# Patient Record
Sex: Female | Born: 2000 | Race: Black or African American | Hispanic: No | Marital: Single | Smoking: Never smoker
Health system: Southern US, Community
[De-identification: ages and names within clinical notes are randomized; demographics above are authoritative.]

## PROBLEM LIST (undated history)

## (undated) DIAGNOSIS — Z789 Other specified health status: Secondary | ICD-10-CM

## (undated) HISTORY — DX: Other specified health status: Z78.9

## (undated) HISTORY — PX: TYMPANOSTOMY TUBE PLACEMENT: SHX32

---
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
## 2019-06-18 ENCOUNTER — Ambulatory Visit: Payer: No Typology Code available for payment source | Attending: Internal Medicine

## 2019-06-18 DIAGNOSIS — Z20822 Contact with and (suspected) exposure to covid-19: Secondary | ICD-10-CM

## 2019-06-20 LAB — NOVEL CORONAVIRUS, NAA: SARS-CoV-2, NAA: NOT DETECTED

## 2021-05-26 ENCOUNTER — Emergency Department
Admission: EM | Admit: 2021-05-26 | Discharge: 2021-05-26 | Disposition: A | Discharge status: Home / Self Care | Payer: Medicaid Other | Attending: Emergency Medicine | Admitting: Emergency Medicine

## 2021-05-26 ENCOUNTER — Other Ambulatory Visit: Payer: Self-pay

## 2021-05-26 ENCOUNTER — Encounter: Payer: Self-pay | Admitting: *Deleted

## 2021-05-26 DIAGNOSIS — L509 Urticaria, unspecified: Secondary | ICD-10-CM | POA: Diagnosis not present

## 2021-05-26 DIAGNOSIS — R0602 Shortness of breath: Secondary | ICD-10-CM | POA: Diagnosis not present

## 2021-05-26 DIAGNOSIS — R1110 Vomiting, unspecified: Secondary | ICD-10-CM | POA: Insufficient documentation

## 2021-05-26 MED ORDER — DIPHENHYDRAMINE HCL 25 MG PO CAPS
25.0000 mg | ORAL_CAPSULE | Freq: Once | ORAL | Status: AC
  Administered 2021-05-26: 06:00:00 25 mg via ORAL
  Filled 2021-05-26: qty 1

## 2021-05-26 MED ORDER — LORATADINE 10 MG PO TABS: 10.0000 mg | ORAL_TABLET | Freq: Every day | ORAL | 0 refills | Status: DC

## 2021-05-26 MED ORDER — EPINEPHRINE 0.3 MG/0.3ML IJ SOAJ: 0.3000 mg | INTRAMUSCULAR | 1 refills | Status: AC | PRN

## 2021-05-26 MED ORDER — LORATADINE 10 MG PO TABS: 10.0000 mg | ORAL_TABLET | Freq: Every day | ORAL | 0 refills | Status: AC

## 2021-05-26 NOTE — Discharge Instructions (Addendum)
You can take the Claritin for any ongoing hives.  I am concerned that you potentially had a mild anaphylactic reaction.  The future if you develop hives and are having difficulty breathing or vomiting, please give yourself the EpiPen and come to the emergency department.

## 2021-05-26 NOTE — ED Triage Notes (Signed)
Pt has red rash on arms and chest.  Pt vomited twice.  Rash also on back.  Pt reported diff breathing.  No resp distress now.  Pt alert  speech clear.

## 2021-05-26 NOTE — ED Notes (Signed)
ED Provider at bedside. 

## 2021-05-26 NOTE — ED Provider Notes (Signed)
[facility]  ____________________________________________   Event Date/Time   First MD Initiated Contact with Patient 05/26/21 0510     (approximate)  I have reviewed the triage vital signs and the nursing notes.   HISTORY  Chief Complaint Rash    HPI Kathleen Lowe is a 20 y.o. female with no significant past medical history presents with a rash.  Symptoms started around 1 AM.  She developed red rash that looked like hives on her upper extremities and back.  She started feeling like she was having difficulty breathing and then vomited.  She took Benadryl which helped somewhat.  No known allergic exposures, including new products foods etc.  No history of anaphylaxis or allergies.  Patient currently feels improved.  Urticaria is also improving.  No dyspnea or throat swelling or ongoing nausea vomiting or diarrhea.         No past medical history on file.  There are no problems to display for this patient.    Prior to Admission medications   Medication Sig Start Date End Date Taking? Authorizing Provider  EPINEPHrine 0.3 mg/0.3 mL IJ SOAJ injection Inject 0.3 mg into the muscle as needed for up to 1 day for anaphylaxis. 05/26/21 05/27/21 Yes Georga Hacking, MD  loratadine (CLARITIN) 10 MG tablet Take 1 tablet (10 mg total) by mouth daily. 05/26/21 06/25/21  Georga Hacking, MD    Allergies Patient has no known allergies.  No family history on file.  Social History Social History   Tobacco Use   Smoking status: Never   Smokeless tobacco: Never  Substance Use Topics   Alcohol use: Not Currently    Review of Systems   Review of Systems  HENT:  Negative for trouble swallowing.   Respiratory:  Positive for shortness of breath.   Gastrointestinal:  Positive for nausea and vomiting. Negative for abdominal pain.  Skin:  Positive for rash.  All other systems reviewed and are negative.  Physical Exam Updated Vital Signs BP 124/84  (BP Location: Right Arm)    Pulse 88    Temp 98.3 F (36.8 C)    Resp 15    Ht 5\' 4"  (1.626 m)    Wt 83.9 kg    LMP 05/05/2021 (Approximate)    SpO2 100%    BMI 31.76 kg/m   Physical Exam Vitals and nursing note reviewed.  Constitutional:      General: She is not in acute distress.    Appearance: Normal appearance.  HENT:     Head: Normocephalic and atraumatic.     Mouth/Throat:     Comments: No posterior oropharyngeal swelling, no oral lesions Eyes:     General: No scleral icterus.    Conjunctiva/sclera: Conjunctivae normal.  Cardiovascular:     Rate and Rhythm: Normal rate and regular rhythm.  Pulmonary:     Effort: Pulmonary effort is normal. No respiratory distress.     Breath sounds: No stridor. No wheezing.  Musculoskeletal:        General: No deformity or signs of injury.     Cervical back: Normal range of motion.  Skin:    General: Skin is dry.     Coloration: Skin is not jaundiced or pale.     Comments: Scattered urticaria on the right upper extremity, neck and back  Neurological:     General: No focal deficit present.     Mental Status: She is alert and oriented to person, place, and time. Mental status  is at baseline.  Psychiatric:        Mood and Affect: Mood normal.        Behavior: Behavior normal.     LABS (all labs ordered are listed, but only abnormal results are displayed)  Labs Reviewed - No data to display ____________________________________________  EKG  N/a ____________________________________________  RADIOLOGY Ky Barban, personally viewed and evaluated these images (plain radiographs) as part of my medical decision making, as well as reviewing the written report by the radiologist.  ED MD interpretation:  n/a    ____________________________________________   PROCEDURES  Procedure(s) performed (including Critical Care):  Procedures   ____________________________________________   INITIAL IMPRESSION / ASSESSMENT AND  PLAN / ED COURSE     20 year old female who presents with urticaria.  Symptoms started acutely around 1 AM and she had associated vomiting and shortness of breath.  She took Benadryl and symptoms significantly improved.  I am evaluating her about 4 hours after the onset of symptoms.  She does still have some urticaria but no ongoing shortness of breath vomiting or facial swelling or difficulty swallowing to suggest ongoing anaphylaxis.  I am concerned that she likely had an anaphylactic reaction however symptoms have largely resolved do not feel that she needs epi at this time.  We will giveher dose of Benadryl and discharged on H1 blocker.  We will also prescribe an EpiPen.  I advised the patient and mom that if her symptoms return including dyspnea difficulty swallowing or vomiting that she return to the emergency department.  Also advised that if this happens again she should see an allergist as we do not know what triggered it.      ____________________________________________   FINAL CLINICAL IMPRESSION(S) / ED DIAGNOSES  Final diagnoses:  Urticaria     ED Discharge Orders          Ordered    loratadine (CLARITIN) 10 MG tablet  Daily,   Status:  Discontinued        05/26/21 0534    EPINEPHrine 0.3 mg/0.3 mL IJ SOAJ injection  As needed        05/26/21 0534    loratadine (CLARITIN) 10 MG tablet  Daily        05/26/21 0535             Note:  This document was prepared using Dragon voice recognition software and may include unintentional dictation errors.    Georga Hacking, MD 05/26/21 [phone]

## 2022-09-14 ENCOUNTER — Emergency Department
Admission: EM | Admit: 2022-09-14 | Discharge: 2022-09-14 | Disposition: A | Discharge status: Home / Self Care | Payer: Medicaid Other | Attending: Emergency Medicine | Admitting: Emergency Medicine

## 2022-09-14 ENCOUNTER — Other Ambulatory Visit: Payer: Self-pay

## 2022-09-14 DIAGNOSIS — Z20822 Contact with and (suspected) exposure to covid-19: Secondary | ICD-10-CM | POA: Insufficient documentation

## 2022-09-14 DIAGNOSIS — J020 Streptococcal pharyngitis: Secondary | ICD-10-CM | POA: Insufficient documentation

## 2022-09-14 LAB — SARS CORONAVIRUS 2 BY RT PCR: SARS Coronavirus 2 by RT PCR: NEGATIVE

## 2022-09-14 LAB — GROUP A STREP BY PCR: Group A Strep by PCR: DETECTED — AB

## 2022-09-14 MED ORDER — PENICILLIN G BENZATHINE 1200000 UNIT/2ML IM SUSY
1.2000 10*6.[IU] | PREFILLED_SYRINGE | Freq: Once | INTRAMUSCULAR | Status: AC
Start: 2022-09-14 — End: 2022-09-14
  Administered 2022-09-14: 1.2 10*6.[IU] via INTRAMUSCULAR
  Filled 2022-09-14: qty 2

## 2022-09-14 NOTE — ED Provider Notes (Signed)
[facility] Emergency Department Provider Note     Event Date/Time   First MD Initiated Contact with Patient 09/14/22 1821     (approximate)   History   Sore Throat   HPI  Kathleen Lowe is a 22 y.o. female presents to the ED for evaluation sore throat x 2 days. She notes subjective fevers and pain with swallowing.  She gives a history of strep throat in the past.  Physical Exam   Triage Vital Signs: ED Triage Vitals  Enc Vitals Group     BP 09/14/22 1631 (!) 125/98     Pulse Rate 09/14/22 1631 98     Resp 09/14/22 1631 18     Temp 09/14/22 1631 98 F (36.7 C)     Temp Source 09/14/22 1631 Oral     SpO2 09/14/22 1631 99 %     Weight --      Height --      Head Circumference --      Peak Flow --      Pain Score 09/14/22 1629 5     Pain Loc --      Pain Edu? --      Excl. in [place]? --     Most recent vital signs: Vitals:   09/14/22 1631  BP: (!) 125/98  Pulse: 98  Resp: 18  Temp: 98 F (36.7 C)  SpO2: 99%    General Awake, no distress. NAD HEENT NCAT. PERRL. EOMI. No rhinorrhea. Mucous membranes are moist.  Uvula is midline but tonsils are erythematous and enlarged. CV:  Good peripheral perfusion. RRR RESP:  Normal effort. CTA ABD:  No distention.    ED Results / Procedures / Treatments   Labs (all labs ordered are listed, but only abnormal results are displayed) Labs Reviewed  GROUP A STREP BY PCR - Abnormal; Notable for the following components:      Result Value   Group A Strep by PCR DETECTED (*)    All other components within normal limits  SARS CORONAVIRUS 2 BY RT PCR    EKG   RADIOLOGY   No results found.   PROCEDURES:  Critical Care performed: No  Procedures   MEDICATIONS ORDERED IN ED: Medications  penicillin g benzathine (BICILLIN LA) 1200000 UNIT/2ML injection 1.2 Million Units (has no administration in time range)     IMPRESSION / MDM / ASSESSMENT AND PLAN / ED COURSE  I reviewed the  triage vital signs and the nursing notes.                              Differential diagnosis includes, but is not limited to, COVID, flu, RSV, strep pharyngitis, AOM, sinusitis  Patient's presentation is most consistent with acute complicated illness / injury requiring diagnostic workup.  Patient's diagnosis is consistent with pharyngitis, as confirmed by her strep PCR.  Patient treated with an IM dose of penicillin G benzathine in the ED.  Patient will be discharged home with directions to continue to monitor and treat fevers with OTC Tylenol.. Patient is to follow up with her primary provider as needed or otherwise directed. Patient is given ED precautions to return to the ED for any worsening or new symptoms.  FINAL CLINICAL IMPRESSION(S) / ED DIAGNOSES   Final diagnoses:  Strep throat     Rx / DC Orders   ED Discharge Orders     None  Note:  This document was prepared using Dragon voice recognition software and may include unintentional dictation errors.    Melvenia Needles, PA-C 09/14/22 1832    Blake Divine, MD 09/14/22 2337

## 2022-09-14 NOTE — Discharge Instructions (Addendum)
Continue to monitor and treat fevers with OTC Tylenol or Motrin.

## 2022-09-14 NOTE — ED Triage Notes (Signed)
Pt to ED via POV from home. Pt reports HA that started yesterday and today is having sore throat and body aches.

## 2023-02-09 ENCOUNTER — Ambulatory Visit (LOCAL_COMMUNITY_HEALTH_CENTER): Payer: Self-pay

## 2023-02-09 VITALS — BP 134/76 | Wt 191.5 lb

## 2023-02-09 DIAGNOSIS — Z3201 Encounter for pregnancy test, result positive: Secondary | ICD-10-CM

## 2023-02-09 DIAGNOSIS — Z309 Encounter for contraceptive management, unspecified: Secondary | ICD-10-CM

## 2023-02-09 LAB — PREGNANCY, URINE: Preg Test, Ur: POSITIVE — AB

## 2023-02-09 MED ORDER — PRENATAL 27-0.8 MG PO TABS: 1.0000 | ORAL_TABLET | Freq: Every day | ORAL | Status: AC

## 2023-02-09 NOTE — Progress Notes (Signed)
UPT positive. Plans prenatal care at [facility]; sent to clerk for preadmit.   The patient was dispensed prenatal vitamins #100 today. I provided counseling today regarding the medication. We discussed the medication, the side effects and when to call clinic.   Positive pregnancy packet reviewed and given to patient. Also counseled on hydration and when to seek medical attention.  Patient given the opportunity to ask questions. Questions answered and verbalizes understanding.   Abagail Kitchens, RN

## 2023-04-11 DIAGNOSIS — Z3490 Encounter for supervision of normal pregnancy, unspecified, unspecified trimester: Secondary | ICD-10-CM | POA: Insufficient documentation

## 2023-04-13 LAB — OB RESULTS CONSOLE HEPATITIS B SURFACE ANTIGEN: Hepatitis B Surface Ag: NEGATIVE

## 2023-04-13 LAB — OB RESULTS CONSOLE RUBELLA ANTIBODY, IGM: Rubella: IMMUNE

## 2023-04-13 LAB — OB RESULTS CONSOLE VARICELLA ZOSTER ANTIBODY, IGG: Varicella: IMMUNE

## 2023-05-10 ENCOUNTER — Other Ambulatory Visit: Payer: Self-pay | Admitting: Obstetrics and Gynecology

## 2023-05-10 DIAGNOSIS — Z3482 Encounter for supervision of other normal pregnancy, second trimester: Secondary | ICD-10-CM

## 2023-05-18 ENCOUNTER — Ambulatory Visit
Admission: RE | Admit: 2023-05-18 | Discharge: 2023-05-18 | Disposition: A | Discharge status: Home / Self Care | Payer: Medicaid Other | Source: Ambulatory Visit | Attending: Obstetrics and Gynecology | Admitting: Obstetrics and Gynecology

## 2023-05-18 DIAGNOSIS — Z3482 Encounter for supervision of other normal pregnancy, second trimester: Secondary | ICD-10-CM | POA: Insufficient documentation

## 2023-05-18 DIAGNOSIS — Z3689 Encounter for other specified antenatal screening: Secondary | ICD-10-CM | POA: Insufficient documentation

## 2023-05-18 DIAGNOSIS — O321XX0 Maternal care for breech presentation, not applicable or unspecified: Secondary | ICD-10-CM | POA: Diagnosis not present

## 2023-05-18 DIAGNOSIS — Z3A22 22 weeks gestation of pregnancy: Secondary | ICD-10-CM | POA: Insufficient documentation

## 2023-05-31 ENCOUNTER — Other Ambulatory Visit: Payer: Self-pay | Admitting: Obstetrics and Gynecology

## 2023-05-31 DIAGNOSIS — Z362 Encounter for other antenatal screening follow-up: Secondary | ICD-10-CM

## 2023-06-08 ENCOUNTER — Other Ambulatory Visit: Payer: Self-pay | Admitting: Obstetrics and Gynecology

## 2023-06-08 DIAGNOSIS — Z362 Encounter for other antenatal screening follow-up: Secondary | ICD-10-CM

## 2023-06-14 NOTE — L&D Delivery Note (Signed)
 Delivery Note  Kathleen ANDON is a G2P0010 at [redacted]w[redacted]d, Patient's last menstrual period was 12/14/2022., consistent with [place] at [redacted]w[redacted]d. Estimated Date of Delivery: 09/20/23   First Stage: Labor onset: 0100 Augmentation:  N/A Analgesia Eliezer Lofts intrapartum: IV Fentanyl SROM at 2100 GBS: negative  Second Stage: Complete dilation at 0755 Onset of pushing at 0755 FHR second stage 120 bpm with moderate variability, early and variable decels with pushing   [name] presented to L&D with regular contractions and SROM in early labor. She was expectantly managed. She progressed quickly from 3cm to C/C/+2 over 90 minutes with a strong urge to push.  She pushed effectively over approximately 10 minutes for a spontaneous vaginal birth.  Delivery of a viable baby boy on 09/21/2023 at 0805 by CNM Delivery of fetal head in OA position with restitution to LOT. No nuchal cord;  Anterior then posterior shoulders delivered easily with gentle downward traction. Baby placed on mom's chest, and attended to by baby RN Cord double clamped after cessation of pulsation, cut by father of baby  Cord blood sample collection: Yes O POS Performed at [facility], [address] [place], [place] [address]   Third Stage: Oxytocin bolus started after delivery of infant for hemorrhage prophylaxis  Placenta delivered via Tomasa Blase mechanism intact with 3 VC @ 0815 Placenta disposition: discarded Uterine tone firm / bleeding moderate  Laceration: small abrasions Anesthesia for repair: N/A Suture: N/A Est. Blood Loss (mL): 150 ml   Complications: None  Mom to postpartum.  Baby to Couplet care / Skin to Skin.  Newborn: Information for the patient's newborn:  [name], [name] [[id number]]  Live born female "Cavautis" Birth Weight:  pending  APGAR: 8, 9  Newborn Delivery   Birth date/time: 09/21/2023 08:05:00 Delivery type: Vaginal, Spontaneous      Feeding planned: breast  feeding  ---------- Margaretmary Eddy, CNM Certified Nurse Midwife [place]  [facility]

## 2023-06-15 ENCOUNTER — Encounter: Payer: Self-pay | Admitting: *Deleted

## 2023-06-21 ENCOUNTER — Ambulatory Visit: Payer: Medicaid Other | Attending: Obstetrics and Gynecology

## 2023-06-21 ENCOUNTER — Ambulatory Visit: Payer: Medicaid Other | Admitting: *Deleted

## 2023-06-21 ENCOUNTER — Encounter: Payer: Self-pay | Admitting: *Deleted

## 2023-06-21 DIAGNOSIS — Z362 Encounter for other antenatal screening follow-up: Secondary | ICD-10-CM | POA: Insufficient documentation

## 2023-07-11 LAB — OB RESULTS CONSOLE RPR: RPR: NONREACTIVE

## 2023-07-11 LAB — OB RESULTS CONSOLE HIV ANTIBODY (ROUTINE TESTING): HIV: NONREACTIVE

## 2023-07-25 DIAGNOSIS — O99210 Obesity complicating pregnancy, unspecified trimester: Secondary | ICD-10-CM | POA: Insufficient documentation

## 2023-08-24 LAB — OB RESULTS CONSOLE GC/CHLAMYDIA
Chlamydia: NEGATIVE
Neisseria Gonorrhea: NEGATIVE

## 2023-08-31 LAB — OB RESULTS CONSOLE GBS: GBS: NEGATIVE

## 2023-09-05 ENCOUNTER — Ambulatory Visit: Payer: Medicaid Other | Admitting: Urology

## 2023-09-20 ENCOUNTER — Inpatient Hospital Stay: Admit: 2023-09-20 | Payer: Medicaid Other

## 2023-09-21 ENCOUNTER — Other Ambulatory Visit: Payer: Self-pay

## 2023-09-21 ENCOUNTER — Inpatient Hospital Stay
Admission: EM | Admit: 2023-09-21 | Discharge: 2023-09-22 | DRG: 807 | Disposition: A | Discharge status: Home / Self Care

## 2023-09-21 ENCOUNTER — Encounter: Payer: Self-pay | Admitting: Obstetrics and Gynecology

## 2023-09-21 DIAGNOSIS — O26893 Other specified pregnancy related conditions, third trimester: Secondary | ICD-10-CM | POA: Diagnosis present

## 2023-09-21 DIAGNOSIS — O99214 Obesity complicating childbirth: Principal | ICD-10-CM | POA: Diagnosis present

## 2023-09-21 DIAGNOSIS — Z3A40 40 weeks gestation of pregnancy: Secondary | ICD-10-CM

## 2023-09-21 DIAGNOSIS — Z8249 Family history of ischemic heart disease and other diseases of the circulatory system: Secondary | ICD-10-CM | POA: Diagnosis not present

## 2023-09-21 LAB — RPR: RPR Ser Ql: NONREACTIVE

## 2023-09-21 LAB — ABO/RH: ABO/RH(D): O POS

## 2023-09-21 LAB — CBC
HCT: 34.9 % — ABNORMAL LOW (ref 36.0–46.0)
Hemoglobin: 12.1 g/dL (ref 12.0–15.0)
MCH: 29.8 pg (ref 26.0–34.0)
MCHC: 34.7 g/dL (ref 30.0–36.0)
MCV: 86 fL (ref 80.0–100.0)
Platelets: 266 10*3/uL (ref 150–400)
RBC: 4.06 MIL/uL (ref 3.87–5.11)
RDW: 12.9 % (ref 11.5–15.5)
WBC: 8.1 10*3/uL (ref 4.0–10.5)
nRBC: 0 % (ref 0.0–0.2)

## 2023-09-21 LAB — TYPE AND SCREEN
ABO/RH(D): O POS
Antibody Screen: NEGATIVE

## 2023-09-21 MED ORDER — LACTATED RINGERS IV SOLN: INTRAVENOUS | Status: DC

## 2023-09-21 MED ORDER — ONDANSETRON HCL 4 MG PO TABS: 4.0000 mg | ORAL_TABLET | ORAL | Status: DC | PRN

## 2023-09-21 MED ORDER — IBUPROFEN 600 MG PO TABS
600.0000 mg | ORAL_TABLET | Freq: Four times a day (QID) | ORAL | Status: DC
  Filled 2023-09-21 (×2): qty 1

## 2023-09-21 MED ORDER — ONDANSETRON HCL 4 MG/2ML IJ SOLN: 4.0000 mg | INTRAMUSCULAR | Status: DC | PRN

## 2023-09-21 MED ORDER — SOD CITRATE-CITRIC ACID 500-334 MG/5ML PO SOLN: 30.0000 mL | ORAL | Status: DC | PRN

## 2023-09-21 MED ORDER — WITCH HAZEL-GLYCERIN EX PADS
1.0000 | MEDICATED_PAD | CUTANEOUS | Status: DC | PRN
  Administered 2023-09-21: 1 via TOPICAL
  Filled 2023-09-21 (×2): qty 100

## 2023-09-21 MED ORDER — DIBUCAINE (PERIANAL) 1 % EX OINT
1.0000 | TOPICAL_OINTMENT | CUTANEOUS | Status: DC | PRN
  Administered 2023-09-21: 1 via RECTAL
  Filled 2023-09-21 (×2): qty 28

## 2023-09-21 MED ORDER — FERROUS SULFATE 325 (65 FE) MG PO TABS
325.0000 mg | ORAL_TABLET | Freq: Two times a day (BID) | ORAL | Status: DC
  Administered 2023-09-22: 325 mg via ORAL
  Filled 2023-09-21: qty 1

## 2023-09-21 MED ORDER — LIDOCAINE HCL (PF) 1 % IJ SOLN: 30.0000 mL | INTRAMUSCULAR | Status: DC | PRN

## 2023-09-21 MED ORDER — SIMETHICONE 80 MG PO CHEW: 80.0000 mg | CHEWABLE_TABLET | ORAL | Status: DC | PRN

## 2023-09-21 MED ORDER — PRENATAL MULTIVITAMIN CH
1.0000 | ORAL_TABLET | Freq: Every day | ORAL | Status: DC
  Administered 2023-09-22: 1 via ORAL
  Filled 2023-09-21: qty 1

## 2023-09-21 MED ORDER — OXYTOCIN BOLUS FROM INFUSION
333.0000 mL | Freq: Once | INTRAVENOUS | Status: AC
  Administered 2023-09-21: 333 mL via INTRAVENOUS

## 2023-09-21 MED ORDER — ACETAMINOPHEN 500 MG PO TABS
1000.0000 mg | ORAL_TABLET | Freq: Four times a day (QID) | ORAL | Status: DC
  Administered 2023-09-21 – 2023-09-22 (×2): 1000 mg via ORAL
  Filled 2023-09-21 (×4): qty 2

## 2023-09-21 MED ORDER — MISOPROSTOL 200 MCG PO TABS
ORAL_TABLET | ORAL | Status: AC
  Filled 2023-09-21: qty 4

## 2023-09-21 MED ORDER — DIPHENHYDRAMINE HCL 25 MG PO CAPS: 25.0000 mg | ORAL_CAPSULE | Freq: Four times a day (QID) | ORAL | Status: DC | PRN

## 2023-09-21 MED ORDER — LIDOCAINE HCL (PF) 1 % IJ SOLN
INTRAMUSCULAR | Status: AC
  Filled 2023-09-21: qty 30

## 2023-09-21 MED ORDER — ACETAMINOPHEN 500 MG PO TABS: 1000.0000 mg | ORAL_TABLET | Freq: Four times a day (QID) | ORAL | Status: DC | PRN

## 2023-09-21 MED ORDER — SODIUM CHLORIDE 0.9% FLUSH: 3.0000 mL | INTRAVENOUS | Status: DC | PRN

## 2023-09-21 MED ORDER — BENZOCAINE-MENTHOL 20-0.5 % EX AERO
1.0000 | INHALATION_SPRAY | CUTANEOUS | Status: DC | PRN
  Administered 2023-09-21: 1 via TOPICAL
  Filled 2023-09-21 (×2): qty 56

## 2023-09-21 MED ORDER — OXYTOCIN-SODIUM CHLORIDE 30-0.9 UT/500ML-% IV SOLN
2.5000 [IU]/h | INTRAVENOUS | Status: DC
  Filled 2023-09-21: qty 500

## 2023-09-21 MED ORDER — LACTATED RINGERS IV SOLN
500.0000 mL | INTRAVENOUS | Status: DC | PRN
  Administered 2023-09-21: 500 mL via INTRAVENOUS
  Administered 2023-09-21: 1000 mL via INTRAVENOUS

## 2023-09-21 MED ORDER — SENNOSIDES-DOCUSATE SODIUM 8.6-50 MG PO TABS
2.0000 | ORAL_TABLET | Freq: Every day | ORAL | Status: DC
  Administered 2023-09-22: 2 via ORAL
  Filled 2023-09-21: qty 2

## 2023-09-21 MED ORDER — ZOLPIDEM TARTRATE 5 MG PO TABS: 5.0000 mg | ORAL_TABLET | Freq: Every evening | ORAL | Status: DC | PRN

## 2023-09-21 MED ORDER — SODIUM CHLORIDE 0.9 % IV SOLN: 250.0000 mL | INTRAVENOUS | Status: DC | PRN

## 2023-09-21 MED ORDER — AMMONIA AROMATIC IN INHA
RESPIRATORY_TRACT | Status: AC
  Filled 2023-09-21: qty 10

## 2023-09-21 MED ORDER — FENTANYL CITRATE (PF) 100 MCG/2ML IJ SOLN
50.0000 ug | INTRAMUSCULAR | Status: DC | PRN
  Administered 2023-09-21: 100 ug via INTRAVENOUS
  Filled 2023-09-21: qty 2

## 2023-09-21 MED ORDER — ONDANSETRON HCL 4 MG/2ML IJ SOLN: 4.0000 mg | Freq: Four times a day (QID) | INTRAMUSCULAR | Status: DC | PRN

## 2023-09-21 MED ORDER — SODIUM CHLORIDE 0.9% FLUSH
3.0000 mL | Freq: Two times a day (BID) | INTRAVENOUS | Status: DC
  Administered 2023-09-21: 3 mL via INTRAVENOUS

## 2023-09-21 MED ORDER — OXYTOCIN 10 UNIT/ML IJ SOLN
INTRAMUSCULAR | Status: AC
  Filled 2023-09-21: qty 2

## 2023-09-21 MED ORDER — SODIUM CHLORIDE 0.9% FLUSH: 3.0000 mL | Freq: Two times a day (BID) | INTRAVENOUS | Status: DC

## 2023-09-21 MED ORDER — COCONUT OIL OIL
1.0000 | TOPICAL_OIL | Status: DC | PRN
  Filled 2023-09-21: qty 7.5

## 2023-09-21 NOTE — Lactation Note (Signed)
 This note was copied from a baby's chart. Lactation Consultation Note  Patient Name: Kathleen Lowe [id number] Date: 09/21/2023 Age:24 hours Reason for consult: Follow-up assessment, term, breastfeeding assistance, staff request   Maternal Data: see initial consult . Requested to assist 1st time mom with breastfeeding. Baby is sleepy at the breast. Has patient been taught Hand Expression?: Yes Does the patient have breastfeeding experience prior to this delivery?: No  Feeding Mother's Current Feeding Choice: Breast Milk Assisted mom with maximizing position and latch techniques. Mom using pillow to support her breast. Mom independendently was able to position baby ion football hold and latch baby. Mom had easily hand expressed drops of colostrum to encourage baby to latch. Mom expressed 10 additional drops of colostrum on to a spoon which was fed to baby. Of note, hand expression encouraged nipple to become erect which aided baby to latch. Baby at both breasts latched and took a few swallows and fell asleep despite tactile stimulation to encourage baby to continue feeding. Discussed with mom baby is in normative sleepy period in the first 24 hours of life. Encouraged mom to look for feeding cues and offer baby to eat with cues. If baby does not awaken with cues in 3 hours recommended mom unswaddle baby, change the diaper and attempt breastfeeding. LATCH Score Latch: Repeated attempts needed to sustain latch, nipple held in mouth throughout feeding, stimulation needed to elicit sucking reflex.  Audible Swallowing: A few with stimulation  Type of Nipple: Flat (nipple everts with manual stimulation, provided mom with a manual pump to use as needed to erect the nipple)  Comfort (Breast/Nipple): Soft / non-tender  Hold (Positioning): No assistance needed to correctly position infant at breast.  LATCH Score: 7   Lactation Tools Discussed/Used Tools: Other (comment)  (spoon)  Interventions Interventions: Breast feeding basics reviewed;Assisted with latch;Breast massage;Hand express;Breast compression;Support pillows;Education Reviewed what to expect when first breastfeeding: feeding cues, how to know the baby is getting enough, cluster feeding, 8-12 feeds in 24 hours, how to wake a sleepy baby.   Consult Status Consult Status: Follow-up Date: 09/22/23 Follow-up type: In-patient  Update provided to care nurse.  Fuller Song 09/21/2023, 9:48 PM

## 2023-09-21 NOTE — Progress Notes (Signed)
 L&D Note    Subjective:  Breathing well with contractions, family at bedside, supportive   Objective:   Vitals:   09/21/23 0355 09/21/23 0437 09/21/23 0705  BP: 115/70  134/73  Pulse:   66  Resp:   18  Temp: 98.3 F (36.8 C)  97.9 F (36.6 C)  TempSrc: Oral  Oral  Weight:  88.9 kg   Height:  5\' 4"  (1.626 m)     Current Vital Signs 24h Vital Sign Ranges  T 97.9 F (36.6 C) Temp  Avg: 98.1 F (36.7 C)  Min: 97.9 F (36.6 C)  Max: 98.3 F (36.8 C)  BP 134/73 BP  Min: 115/70  Max: 134/73  HR 66 Pulse  Avg: 66  Min: 66  Max: 66  RR 18 Resp  Avg: 18  Min: 18  Max: 18  SaO2     No data recorded      Gen: fatigued cooperative, no distress FHR: Baseline: 120 bpm, Variability: minimal , Accels: Present, Decels: early Toco: regular, every 2 minutes SVE: Dilation: 3 Effacement (%): 90 Cervical Position: Middle Station: -2 Exam by:: D Cox RN  Medications SCHEDULED MEDICATIONS   oxytocin 40 units in LR 1000 mL  333 mL Intravenous Once   sodium chloride flush  3 mL Intravenous Q12H    MEDICATION INFUSIONS   sodium chloride     lactated ringers 1,000 mL (09/21/23 0648)   lactated ringers 125 mL/hr at 09/21/23 0721   oxytocin      PRN MEDICATIONS  sodium chloride, acetaminophen, fentaNYL (SUBLIMAZE) injection, lactated ringers, lidocaine (PF), ondansetron, sodium chloride flush, sodium citrate-citric acid   Assessment & Plan:  23 y.o. G2P0010 at [redacted]w[redacted]d admitted for early labor and SROM -Labor: Early latent labor. -Fetal Well-being: Category II for minimal variability following  -GBS: negative -Membranes ruptured, clear fluid at 2100 -Expectant management. Utilize positionchanges to encourage labor.  -Analgesia: unmedicated labor support options    Gustavo Lah, [place]  09/21/2023 7:39 AM  [facility]

## 2023-09-21 NOTE — H&P (Signed)
 OB History & Physical   History of Present Illness:   Chief Complaint: SROM and contractions   HPI:  Kathleen Lowe is a 23 y.o. G2P0010 female at [redacted]w[redacted]d, Patient's last menstrual period was 12/14/2022., consistent with [place] at [redacted]w[redacted]d, with Estimated Date of Delivery: 09/20/23.  She presents to L&D for painful contractions that have gotten progressively worse. She also reports her water broke at 2100.  Her pregnancy is uncomplicated.  She denies Vaginal bleeding. Endorses fetal movement as active.   Reports active fetal movement  Contractions: every 2 to 3 minutes LOF/SROM: clear fluid at 2100 Vaginal bleeding: denies   Factors complicating pregnancy:  Principal Problem:   Normal labor    Prenatal care site:  [facility]  Patient Active Problem List   Diagnosis Date Noted   Normal labor 09/21/2023   Obesity in pregnancy, antepartum 07/25/2023   Supervision of normal pregnancy 04/11/2023   Family history of bleeding disorder 01/19/2015    Prenatal Transfer Tool  Maternal Diabetes: No Genetic Screening: Normal Maternal Ultrasounds/Referrals: Normal Fetal Ultrasounds or other Referrals:  None Maternal Substance Abuse:  No Significant Maternal Medications:  None Significant Maternal Lab Results: Group B Strep negative  Maternal Medical History:   Past Medical History:  Diagnosis Date   Medical history non-contributory    Patient denies medical problems     Past Surgical History:  Procedure Laterality Date   TYMPANOSTOMY TUBE PLACEMENT      Allergies  Allergen Reactions   Aspirin    Nsaids     Prior to Admission medications   Medication Sig Start Date End Date Taking? Authorizing Provider  Prenatal Vit-Fe Fumarate-FA (PRENATAL MULTIVITAMIN) TABS tablet Take 1 tablet by mouth daily at 12 noon.   Yes [provider]  loratadine (CLARITIN) 10 MG tablet Take 1 tablet (10 mg total) by mouth daily. 05/26/21 06/25/21  Georga Hacking, MD    OB  History  Gravida Para Term Preterm AB Living  2 0 0 0 1 0  SAB IAB Ectopic Multiple Live Births  1 0 0 0 0    # Outcome Date GA Lbr Len/2nd Weight Sex Type Anes PTL Lv  2 Current           1 SAB 09/2021             Social History: She  reports that she has never smoked. She has never used smokeless tobacco. She reports that she does not currently use alcohol. She reports that she does not currently use drugs after having used the following drugs: Marijuana.  Family History: family history includes Hypertension in her maternal grandfather and maternal grandmother.   Review of Systems: A full review of systems was performed and negative except as noted in the HPI.     Physical Exam:  Vital Signs: BP 115/70   Temp 98.3 F (36.8 C) (Oral)   LMP 12/14/2022   General: no acute distress.  HEENT: normocephalic, atraumatic Heart: regular rate & rhythm Lungs: normal respiratory effort Abdomen: soft, gravid, non-tender;  EFW: 7 lbs Pelvic:   External: Normal external female genitalia  Cervix: Dilation: 3 / Effacement (%): 70 / Station: -2    Extremities: non-tender, symmetric, no edema bilaterally.  DTRs: 2+/2+  Neurologic: Alert & oriented x 3.    No results found for this or any previous visit (from the past 24 hours).  Pertinent Results:  Prenatal Labs: Blood type/Rh O pos  Antibody screen Negative    Rubella  Immune    Varicella Immune  RPR NR    HBsAg Neg   Hep C NR   HIV Neg    GC neg  Chlamydia neg  Genetic screening cfDNA negative   1 hour GTT 93  3 hour GTT N/A  GBS Neg     FHT:  FHR: 120 bpm, variability: moderate,  accelerations:  Present,  decelerations:  Absent Category/reactivity:  Category I UC:   regular, every 2-4 minutes   Cephalic by [place] and SVE   No results found.  Assessment:  Kathleen Lowe is a 23 y.o. G2P0010 female at [redacted]w[redacted]d with early labor .   Plan:  1. Admit to Labor & Delivery - Admission status: Inpatient - Dr Virgel Manifold MD  notified of admission and plan of care  - Reason for admission: labor management - consents reviewed and obtained  2. Fetal Well being  - Fetal Tracing: Cat 1 - Group B Streptococcus ppx not indicated: GBS negative - Presentation: cephalic confirmed by SVE    3. Routine OB: - Prenatal labs reviewed, as above - Rh positive - CBC, T&S, RPR on admit -  Labor diet , saline lock  4. Monitoring of labor  - Contractions monitored with external toco - Pelvis adequate for trial of labor  - Plan for expectant management  - Augmentation with oxytocin as appropriate  - Plan for  continuous fetal monitoring - Maternal pain control as desired; planning low intervention birth options  - Anticipate vaginal delivery  5. . Post Partum Planning: - Infant feeding: breast feeding - Contraception: oral progesterone-only contraceptive - Flu vaccine:  declined  - Tdap vaccine: Given prenatally - RSV vaccine: Not in season  Gustavo Lah, [place] 09/21/23 4:07 AM  Margaretmary Eddy, CNM Certified Nurse Midwife [place]  [facility]

## 2023-09-21 NOTE — Discharge Summary (Signed)
 Postpartum Discharge Summary  Patient Name: Kathleen Lowe DOB: 12-18-2000 MRN: [id number]  Date of admission: 09/21/2023 Delivery date:09/21/2023 Delivering provider: Gustavo Lah Date of discharge: 09/22/2023  Primary OB: [facility] [id number] last menstrual period was 12/14/2022. EDC Estimated Date of Delivery: 09/20/23 Gestational Age at Delivery: [redacted]w[redacted]d   Admitting diagnosis: Normal labor [O80, Z37.9] Intrauterine pregnancy: [redacted]w[redacted]d     Secondary diagnosis:   Principal Problem:   Normal labor  Discharge Diagnosis: Term Pregnancy Delivered      Hospital course: Onset of Labor With Vaginal Delivery      22 y.o. yo G2P1011 at [redacted]w[redacted]d was admitted in Latent Labor on 09/21/2023. Labor course was uncomplicated. She was expectantly managed. She progressed quickly from 3cm to C/C/+2 over 90 minutes with a strong urge to push.  She pushed effectively over approximately 10 minutes for a spontaneous vaginal birth.  Membrane Rupture Time/Date: 9:00 PM,09/20/2023  Delivery Method:Vaginal, Spontaneous Operative Delivery:N/A Episiotomy: None Lacerations:  None Patient had a postpartum course without complication.  She is ambulating, tolerating a regular diet, passing flatus, and urinating well. Patient is discharged home in stable condition on 09/22/23.  Newborn Data: Birth date:09/21/2023 Birth time:8:05 AM Gender:Female "Cavautis" Living status:Living Apgars:8 ,9  Weight:3270 g                                            Post partum procedures: none Augmentation:: N/A Complications: None Delivery Type: spontaneous vaginal delivery Anesthesia: IV narcotics Placenta: spontaneous To Pathology: No   Prenatal Labs:  Blood type/Rh O pos  Antibody screen Negative    Rubella Immune    Varicella Immune  RPR NR    HBsAg Neg   Hep C NR   HIV Neg    GC neg  Chlamydia neg  Genetic screening cfDNA negative   1 hour GTT 93  3 hour GTT N/A  GBS Neg      Magnesium Sulfate  received: No BMZ received: No Rhophylac:was not indicated MMR: was not indicated Varivax vaccine given: was not indicated - Tdap vaccine: Given prenatally - Flu vaccine:  declined  -RSV vaccine: Not in season  Transfusion:No  Physical exam  Vitals:   09/21/23 1147 09/21/23 1712 09/21/23 2043 09/21/23 2330  BP: 114/75 106/66 115/73 104/75  Pulse: 86 88 81 88  Resp: 16 18 18 20   Temp: 98.8 F (37.1 C) 98.4 F (36.9 C) 99 F (37.2 C) 98.2 F (36.8 C)  TempSrc: Oral Oral Oral Oral  SpO2: 99% 97% 95% 98%  Weight:      Height:       General: alert, cooperative, and no distress Lochia: appropriate Uterine Fundus: firm Perineum: minimal edema/intact DVT Evaluation: No evidence of DVT seen on physical exam.  Labs: Lab Results  Component Value Date   WBC 15.4 (H) 09/22/2023   HGB 11.3 (L) 09/22/2023   HCT 33.0 (L) 09/22/2023   MCV 87.8 09/22/2023   PLT 226 09/22/2023      Latest Ref Rng & Units 03/31/2014    5:30 PM  CMP  Glucose 65 - 99 mg/dL 91   BUN 9 - 21 mg/dL 12   Creatinine 9.52 - 1.30 mg/dL 8.41   Sodium 324 - 401 mmol/L 139   Potassium 3.3 - 4.7 mmol/L 4.0   Chloride 97 - 107 mmol/L 105   CO2 16 - 25 mmol/L 27  Calcium 9.0 - 10.6 mg/dL 8.5    Edinburgh Score:    09/21/2023    8:01 PM  Edinburgh Postnatal Depression Scale Screening Tool  I have been able to laugh and see the funny side of things. 0  I have looked forward with enjoyment to things. 1  I have blamed myself unnecessarily when things went wrong. 2  I have been anxious or worried for no good reason. 2  I have felt scared or panicky for no good reason. 0  Things have been getting on top of me. 1  I have been so unhappy that I have had difficulty sleeping. 1  I have felt sad or miserable. 1  I have been so unhappy that I have been crying. 1  The thought of harming myself has occurred to me. 0  Edinburgh Postnatal Depression Scale Total 9    Risk assessment for postpartum VTE and  prophylactic treatment: Very high risk factors: None High risk factors: None Moderate risk factors: None  Postpartum VTE prophylaxis with LMWH not indicated  After visit meds:  Allergies as of 09/22/2023       Reactions   Aspirin    Nsaids         Medication List     TAKE these medications    acetaminophen 500 MG tablet Commonly known as: TYLENOL Take 2 tablets (1,000 mg total) by mouth every 6 (six) hours as needed for mild pain (pain score 1-3) or fever.   benzocaine-Menthol 20-0.5 % Aero Commonly known as: DERMOPLAST Apply 1 Application topically as needed for irritation (perineal discomfort).   ferrous sulfate 325 (65 FE) MG tablet Take 1 tablet (325 mg total) by mouth 2 (two) times daily with a meal.   loratadine 10 MG tablet Commonly known as: Claritin Take 1 tablet (10 mg total) by mouth daily.   prenatal multivitamin Tabs tablet Take 1 tablet by mouth daily at 12 noon.   senna-docusate 8.6-50 MG tablet Commonly known as: Senokot-S Take 2 tablets by mouth at bedtime as needed for mild constipation.   witch hazel-glycerin pad Commonly known as: TUCKS Apply 1 Application topically as needed for hemorrhoids.       Discharge home in stable condition Infant Feeding: Breast Infant Disposition:home with mother Discharge instruction: per After Visit Summary and Postpartum booklet. Activity: Advance as tolerated. Pelvic rest for 6 weeks.  Diet: routine diet Anticipated Birth Control:  Contraceptives: Progesterone only pills Postpartum Appointment:6 weeks Additional Postpartum F/U:  none Future Appointments:No future appointments. Follow up Visit:  Follow-up Information     Gustavo Lah, CNM. Schedule an appointment as soon as possible for a visit in 6 week(s).   Specialty: Certified Nurse Midwife Why: postpartum visit Contact information: [address] [place] [address] [phone]                 Plan:  Kathleen Lowe  was discharged to home in good condition. Follow-up appointment as directed.    Signed:  Janyce Llanos, CNM 09/22/2023 8:52 AM

## 2023-09-21 NOTE — Lactation Note (Signed)
 This note was copied from a baby's chart. Lactation Consultation Note  Patient Name: Kathleen Lowe [id number] Date: 09/21/2023 Age:24 hours Reason for consult: Initial assessment;Mother's request;Difficult latch;Primapara;1st time breastfeeding;Breastfeeding assistance   Maternal Data Has patient been taught Hand Expression?: Yes Does the patient have breastfeeding experience prior to this delivery?: No MOB requests LC assistance in latching infant. MOB breast size necessitates adequate support utilizing blanket/towel/pillow to position breast at level ideal for infant to manage. MOB denies pain or discomfort and will reach out for help should any issues or concerns arise.  Feeding Mother's Current Feeding Choice: Breast Milk MOB is planning on pumping milk upon return home utilizing [facility] services and pump, along with feeding directly from breast upon demonstration of infant hunger cues. LC assist in positioning infant in football hold and teacup position to compress breast tissue to facilitate deep infant latch. LC observed breast tissue edema likely from fluid drip during labor, will need to review status of breasts upon follow up assessment.  LATCH Score Latch: Repeated attempts needed to sustain latch, nipple held in mouth throughout feeding, stimulation needed to elicit sucking reflex.  Audible Swallowing: Spontaneous and intermittent  Type of Nipple: Flat (Flat nipple will evert with stimulation and compression)  Comfort (Breast/Nipple): Filling, red/small blisters or bruises, mild/mod discomfort (MOB reports mild discomfort)  Hold (Positioning): Assistance needed to correctly position infant at breast and maintain latch. (Football hold with support under breast and teacup nipple hold to sustain latch.)  LATCH Score: 6   Lactation Tools Discussed/Used   Discussed normative infant feeding patterns and behavior, input/output during first 24 hours of life, infant lip/mouth gape  needed for effective latch, breast compressions during nursing/pumping to encourage colostrum and milk removal.  Interventions Interventions: Breast feeding basics reviewed;Assisted with latch;Breast massage;Hand express;Breast compression;Adjust position;Support pillows;Position options;Ice;Education Continue utilizing pillows/blankets to support breast, wrist, and infant position during feeds to optimize maternal comfort and most effective latching.  Discharge Discharge Education: Engorgement and breast care;Warning signs for feeding baby;Outpatient recommendation (Briefly touched on discharge education, will need review prior to discharge date) Pump: DEBP;WIC Pump WIC Program: Yes  Consult Status Consult Status: Follow-up Date: 09/22/23 (Please assess latch and condition of breasts) Follow-up type: In-patient    Lennie Hummer 09/21/2023, 2:10 PM

## 2023-09-21 NOTE — Discharge Instructions (Signed)
 Vaginal Delivery, Care After Refer to this sheet in the next few weeks. These discharge instructions provide you with information on caring for yourself after delivery. Your caregiver may also give you specific instructions. Your treatment has been planned according to the most current medical practices available, but problems sometimes occur. Call your caregiver if you have any problems or questions after you go home. HOME CARE INSTRUCTIONS Take over-the-counter or prescription medicines only as directed by your caregiver or pharmacist. Do not drink alcohol, especially if you are breastfeeding or taking medicine to relieve pain. Do not smoke tobacco. Continue to use good perineal care. Good perineal care includes: Wiping your perineum from back to front Keeping your perineum clean. You can do sitz baths twice a day, to help keep this area clean Do not use tampons, douche or have sex until your caregiver says it is okay. Shower only and avoid sitting in submerged water, aside from sitz baths Wear a well-fitting bra that provides breast support. Eat healthy foods. Drink enough fluids to keep your urine clear or pale yellow. Eat high-fiber foods such as whole grain cereals and breads, brown rice, beans, and fresh fruits and vegetables every day. These foods may help prevent or relieve constipation. Avoid constipation with high fiber foods or medications, such as miralax or metamucil Follow your caregiver's recommendations regarding resumption of activities such as climbing stairs, driving, lifting, exercising, or traveling. Talk to your caregiver about resuming sexual activities. Resumption of sexual activities is dependent upon your risk of infection, your rate of healing, and your comfort and desire to resume sexual activity. Try to have someone help you with your household activities and your newborn for at least a few days after you leave the hospital. Rest as much as possible. Try to rest or  take a nap when your newborn is sleeping. Increase your activities gradually. Keep all of your scheduled postpartum appointments. It is very important to keep your scheduled follow-up appointments. At these appointments, your caregiver will be checking to make sure that you are healing physically and emotionally. SEEK MEDICAL CARE IF:  You are passing large clots from your vagina. Save any clots to show your caregiver. You have a foul smelling discharge from your vagina. You have trouble urinating. You are urinating frequently. You have pain when you urinate. You have a change in your bowel movements. You have increasing redness, pain, or swelling near your vaginal incision (episiotomy) or vaginal tear. You have pus draining from your episiotomy or vaginal tear. Your episiotomy or vaginal tear is separating. You have painful, hard, or reddened breasts. You have a severe headache. You have blurred vision or see spots. You feel sad or depressed. You have thoughts of hurting yourself or your newborn. You have questions about your care, the care of your newborn, or medicines. You are dizzy or light-headed. You have a rash. You have nausea or vomiting. You were breastfeeding and have not had a menstrual period within 12 weeks after you stopped breastfeeding. You are not breastfeeding and have not had a menstrual period by the 12th week after delivery. You have a fever. SEEK IMMEDIATE MEDICAL CARE IF:  You have persistent pain. You have chest pain. You have shortness of breath. You faint. You have leg pain. You have stomach pain. Your vaginal bleeding saturates two or more sanitary pads in 1 hour. MAKE SURE YOU:  Understand these instructions. Will watch your condition. Will get help right away if you are not doing well or  get worse. Document Released: 05/27/2000 Document Revised: 10/14/2013 Document Reviewed: 01/25/2012 [facility] Patient Information 2015 [place], [place]. This  information is not intended to replace advice given to you by your health care provider. Make sure you discuss any questions you have with your health care provider.  Sitz Bath A sitz bath is a warm water bath taken in the sitting position. The water covers only the hips and butt (buttocks). We recommend using one that fits in the toilet, to help with ease of use and cleanliness. It may be used for either healing or cleaning purposes. Sitz baths are also used to relieve pain, itching, or muscle tightening (spasms). The water may contain medicine. Moist heat will help you heal and relax.  HOME CARE  Take 3 to 4 sitz baths a day. Fill the bathtub half-full with warm water. Sit in the water and open the drain a little. Turn on the warm water to keep the tub half-full. Keep the water running constantly. Soak in the water for 15 to 20 minutes. After the sitz bath, pat the affected area dry. GET HELP RIGHT AWAY IF: You get worse instead of better. Stop the sitz baths if you get worse. MAKE SURE YOU: Understand these instructions. Will watch your condition. Will get help right away if you are not doing well or get worse. Document Released: 07/07/2004 Document Revised: 02/22/2012 Document Reviewed: 09/27/2010 [facility] Patient Information 2015 [place], [place]. This information is not intended to replace advice given to you by your health care provider. Make sure you discuss any questions you have with your health care provider.

## 2023-09-22 LAB — CBC
HCT: 33 % — ABNORMAL LOW (ref 36.0–46.0)
Hemoglobin: 11.3 g/dL — ABNORMAL LOW (ref 12.0–15.0)
MCH: 30.1 pg (ref 26.0–34.0)
MCHC: 34.2 g/dL (ref 30.0–36.0)
MCV: 87.8 fL (ref 80.0–100.0)
Platelets: 226 10*3/uL (ref 150–400)
RBC: 3.76 MIL/uL — ABNORMAL LOW (ref 3.87–5.11)
RDW: 13.2 % (ref 11.5–15.5)
WBC: 15.4 10*3/uL — ABNORMAL HIGH (ref 4.0–10.5)
nRBC: 0 % (ref 0.0–0.2)

## 2023-09-22 MED ORDER — FERROUS SULFATE 325 (65 FE) MG PO TABS: 325.0000 mg | ORAL_TABLET | Freq: Two times a day (BID) | ORAL | 3 refills | Status: AC

## 2023-09-22 MED ORDER — WITCH HAZEL-GLYCERIN EX PADS: 1.0000 | MEDICATED_PAD | CUTANEOUS | 0 refills | Status: AC | PRN

## 2023-09-22 MED ORDER — SENNOSIDES-DOCUSATE SODIUM 8.6-50 MG PO TABS: 2.0000 | ORAL_TABLET | Freq: Every evening | ORAL | 0 refills | Status: AC | PRN

## 2023-09-22 MED ORDER — ACETAMINOPHEN 500 MG PO TABS: 1000.0000 mg | ORAL_TABLET | Freq: Four times a day (QID) | ORAL | 0 refills | Status: AC | PRN

## 2023-09-22 MED ORDER — BENZOCAINE-MENTHOL 20-0.5 % EX AERO
1.0000 | INHALATION_SPRAY | CUTANEOUS | 0 refills | Status: AC | PRN
Start: 2023-09-22 — End: ?

## 2023-09-22 NOTE — Progress Notes (Signed)
 Pt discharged with infant.  Discharge instructions, prescriptions and follow up appointment given to and reviewed with pt. Pt verbalized understanding. Escorted out by auxillary.

## 2023-09-22 NOTE — Lactation Note (Signed)
 This note was copied from a baby's chart. Lactation Consultation Note  Patient Name: Boy Shanece Cochrane [id number] Date: 09/22/2023 Age:24 hours Reason for consult: Follow-up assessment;Primapara;Term;Other (Comment) (Discharge Education)   Maternal Data Follow up assessment w/ a P1 patient and 30hr old baby boy.  Patient stated that feeding has been going well.  She had a couple of questions in regards to pumping and pacifiers.   Feeding Mother's Current Feeding Choice: Breast Milk  No feeding observed.   Interventions Interventions: Education;CDC milk storage guidelines  Discharge Discharge Education: Engorgement and breast care;Warning signs for feeding baby;Outpatient recommendation  Education on engorgement prevention/treatment was discussed as well as breastmilk storage guidelines.  LC provided patient with a handout on breastmilk storage guidelines from [facility]. [facility] outpatient lactation services phone number written on the white board in the room.  Patient verbalized understanding.  LC also provided education from the postpartum book about warning signs to look for in a poor feeding.    LC discussed use of pacifiers as well as pumping to help answer patient's questions.   Consult Status Consult Status: Complete Follow-up type: Call as needed    Yvette Rack Amey Hossain 09/22/2023, 2:13 PM
